# Patient Record
Sex: Female | Born: 1960 | Hispanic: No | Marital: Single | Smoking: Current every day smoker
Health system: Southern US, Community
[De-identification: ages and names within clinical notes are randomized; demographics above are authoritative.]

---

## 2006-03-17 ENCOUNTER — Ambulatory Visit: Payer: Self-pay | Admitting: Family Medicine

## 2010-08-03 ENCOUNTER — Emergency Department: Payer: Self-pay | Admitting: Emergency Medicine

## 2021-01-17 ENCOUNTER — Ambulatory Visit (HOSPITAL_COMMUNITY)
Admission: EM | Admit: 2021-01-17 | Discharge: 2021-01-17 | Disposition: A | Discharge status: Home / Self Care | Payer: Self-pay | Attending: Family Medicine | Admitting: Family Medicine

## 2021-01-17 ENCOUNTER — Ambulatory Visit (INDEPENDENT_AMBULATORY_CARE_PROVIDER_SITE_OTHER): Payer: Self-pay

## 2021-01-17 ENCOUNTER — Encounter (HOSPITAL_COMMUNITY): Payer: Self-pay

## 2021-01-17 ENCOUNTER — Other Ambulatory Visit: Payer: Self-pay

## 2021-01-17 DIAGNOSIS — W19XXXA Unspecified fall, initial encounter: Secondary | ICD-10-CM

## 2021-01-17 DIAGNOSIS — M25511 Pain in right shoulder: Secondary | ICD-10-CM

## 2021-01-17 DIAGNOSIS — S52501A Unspecified fracture of the lower end of right radius, initial encounter for closed fracture: Secondary | ICD-10-CM

## 2021-01-17 DIAGNOSIS — I10 Essential (primary) hypertension: Secondary | ICD-10-CM

## 2021-01-17 DIAGNOSIS — M25531 Pain in right wrist: Secondary | ICD-10-CM

## 2021-01-17 NOTE — ED Provider Notes (Signed)
[facility]   [id number] 01/17/21 Arrival Time: 0913  ASSESSMENT & PLAN:  1. Closed fracture of distal end of right radius, unspecified fracture morphology, initial encounter   2. Right wrist pain   3. Acute pain of right shoulder   4. Elevated blood pressure reading in office with diagnosis of hypertension    I have personally viewed the imaging studies ordered this visit. Distal radius fracture. Shoulder appears normal.    Discharge Instructions      Please rest, ice and elevate the affected extremity. If not allergic, you may take Motrin 600mg  every 8 hours or Tylenol 1000mg  every 6 hours or as needed for discomfort. Follow up with orthopaedic surgery within one week for further evaluation. Please call for any appointment. Do not remove your splint. Please return here if you are experiencing increased pain, tingling/numbness, swelling, redness, or fever.  Your blood pressure was noted to be elevated during your visit today. If you are currently taking medication for high blood pressure, please ensure you are taking this as directed. If you do not have a history of high blood pressure and your blood pressure remains persistently elevated, you may need to begin taking a medication at some point. You may return here within the next few days to recheck if unable to see your primary care provider or if you do not have a one.  BP (!) 189/103 (BP Location: Left Arm) Comment: Pt states she does not take BP medication  Pulse 94   Temp 98.2 F (36.8 C) (Oral)   Resp 19   SpO2 97%   BP Readings from Last 3 Encounters:  01/17/21 (!) 189/103      No symptoms of hypertensive urgency.  Orders Placed This Encounter  Procedures   DG Wrist Complete Right   DG Shoulder Right   Apply splint short arm   Apply Shoulder Immobilizer/Sling    Recommend:   Follow-up Information     Schedule an appointment as soon as possible for a visit  with , MD.   Specialty:  Orthopedic Surgery Contact information: [address] [place] [address] [place] [phone]                 Reviewed expectations re: course of current medical issues. Questions answered. Outlined signs and symptoms indicating need for more acute intervention. Patient verbalized understanding. After Visit Summary given.  SUBJECTIVE: History from: patient. Alyssa Jenkins is a 60 y.o. female who reports R wrist and shoulder pain s/p fall two d ago; most pain in wrist; some swelling. No extremity sensation changes or weakness. R-handed. No tx PTA. No head injury.  History reviewed. No pertinent surgical history.   Increased blood pressure noted today. Reports that she is not currently treated for HTN but has been told BP high regularly. She reports no chest pain on exertion, no dyspnea on exertion, no swelling of ankles, no orthostatic dizziness or lightheadedness, no orthopnea or paroxysmal nocturnal dyspnea, and no palpitations.   OBJECTIVE:  Vitals:   01/17/21 1035  BP: (!) 189/103  Pulse: 94  Resp: 19  Temp: 98.2 F (36.8 C)  TempSrc: Oral  SpO2: 97%    General appearance: alert; no distress HEENT: [place]; AT Neck: supple with FROM Resp: unlabored respirations Extremities: RUE: warm with well perfused appearance; fairly well localized moderate tenderness over right radius; without gross deformities; swelling: minimal; bruising: none; wirst ROM: limited by reported pain; no bony tenderness over shoulder; shoulder with FROM CV:  brisk extremity capillary refill of RUE; 2+ radial pulse of RUE. Skin: warm and dry; no visible rashes Neurologic: gait normal; normal sensation and strength of RUE Psychological: alert and cooperative; normal mood and affect  Imaging: DG Shoulder Right  Result Date: 01/17/2021 CLINICAL DATA:  Fall EXAM: RIGHT SHOULDER - 2+ VIEW COMPARISON:  None. FINDINGS: There is no evidence of acute fracture or dislocation on internal  rotation frontal and scapular Y-view. IMPRESSION: No evidence of acute fracture. Electronically Signed   By: Caprice Renshaw M.D.   On: 01/17/2021 11:25   DG Wrist Complete Right  Result Date: 01/17/2021 CLINICAL DATA:  Fall EXAM: RIGHT WRIST - COMPLETE 3+ VIEW COMPARISON:  None. FINDINGS: There is a nondisplaced distal radial metaphyseal fracture. Minimal loss of the volar tilt. Extensive adjacent soft tissue swelling. No visible intra-articular extension radiographically. Normal radiocarpal and intercarpal alignment. No evidence of concomitant ulnar fracture or additional fracture in the hand. Lunotriquetral coalition. IMPRESSION: Nondisplaced distal radial metaphyseal fracture. Electronically Signed   By: Caprice Renshaw M.D.   On: 01/17/2021 11:23      No Known Allergies  History reviewed. No pertinent past medical history. Social History   Socioeconomic History   Marital status: Single    Spouse name: Not on file   Number of children: Not on file   Years of education: Not on file   Highest education level: Not on file  Occupational History   Not on file  Tobacco Use   Smoking status: Every Day    Packs/day: 0.50    Types: Cigarettes   Smokeless tobacco: Never  Vaping Use   Vaping Use: Never used  Substance and Sexual Activity   Alcohol use: Yes   Drug use: Never   Sexual activity: Not on file  Other Topics Concern   Not on file  Social History Narrative   Not on file   Social Determinants of Health   Financial Resource Strain: Not on file  Food Insecurity: Not on file  Transportation Needs: Not on file  Physical Activity: Not on file  Stress: Not on file  Social Connections: Not on file   Family History  Problem Relation Age of Onset   Hypertension Mother    Seizures Father    History reviewed. No pertinent surgical history.     Mardella Layman, MD 01/17/21 1140

## 2021-01-17 NOTE — ED Notes (Signed)
Called in lobby, nos answered.

## 2021-01-17 NOTE — Discharge Instructions (Addendum)
Please rest, ice and elevate the affected extremity. If not allergic, you may take Motrin 600mg  every 8 hours or Tylenol 1000mg  every 6 hours or as needed for discomfort. Follow up with orthopaedic surgery within one week for further evaluation. Please call for any appointment. Do not remove your splint. Please return here if you are experiencing increased pain, tingling/numbness, swelling, redness, or fever.  Your blood pressure was noted to be elevated during your visit today. If you are currently taking medication for high blood pressure, please ensure you are taking this as directed. If you do not have a history of high blood pressure and your blood pressure remains persistently elevated, you may need to begin taking a medication at some point. You may return here within the next few days to recheck if unable to see your primary care provider or if you do not have a one.  BP (!) 189/103 (BP Location: Left Arm) Comment: Pt states she does not take BP medication  Pulse 94   Temp 98.2 F (36.8 C) (Oral)   Resp 19   SpO2 97%   BP Readings from Last 3 Encounters:  01/17/21 (!) 189/103

## 2021-01-17 NOTE — Progress Notes (Signed)
Orthopedic Tech Progress Note Patient Details:  Alyssa Jenkins 1961-03-09 [id number]  Ortho Devices Type of Ortho Device: Sugartong splint, Arm sling Ortho Device/Splint Location: RUE Ortho Device/Splint Interventions: Application, Ordered   Post Interventions Patient Tolerated: Well Instructions Provided: Care of device, Poper ambulation with device  Render Alyssa Jenkins 01/17/2021, 12:35 PM

## 2021-01-17 NOTE — ED Notes (Signed)
Ortho tech called 

## 2021-01-17 NOTE — ED Triage Notes (Signed)
Pt presents with right wrist injury. States she fell getting out of the tub on Tuesday night and c/o right shoulder pain.

## 2023-04-25 IMAGING — DX DG SHOULDER 2+V*R*
2 series · 2 of 2 positions shown · non-contrast
Comparison: None.

CLINICAL DATA: Fall

EXAM:
RIGHT SHOULDER - 2+ VIEW

[shoulder ap]
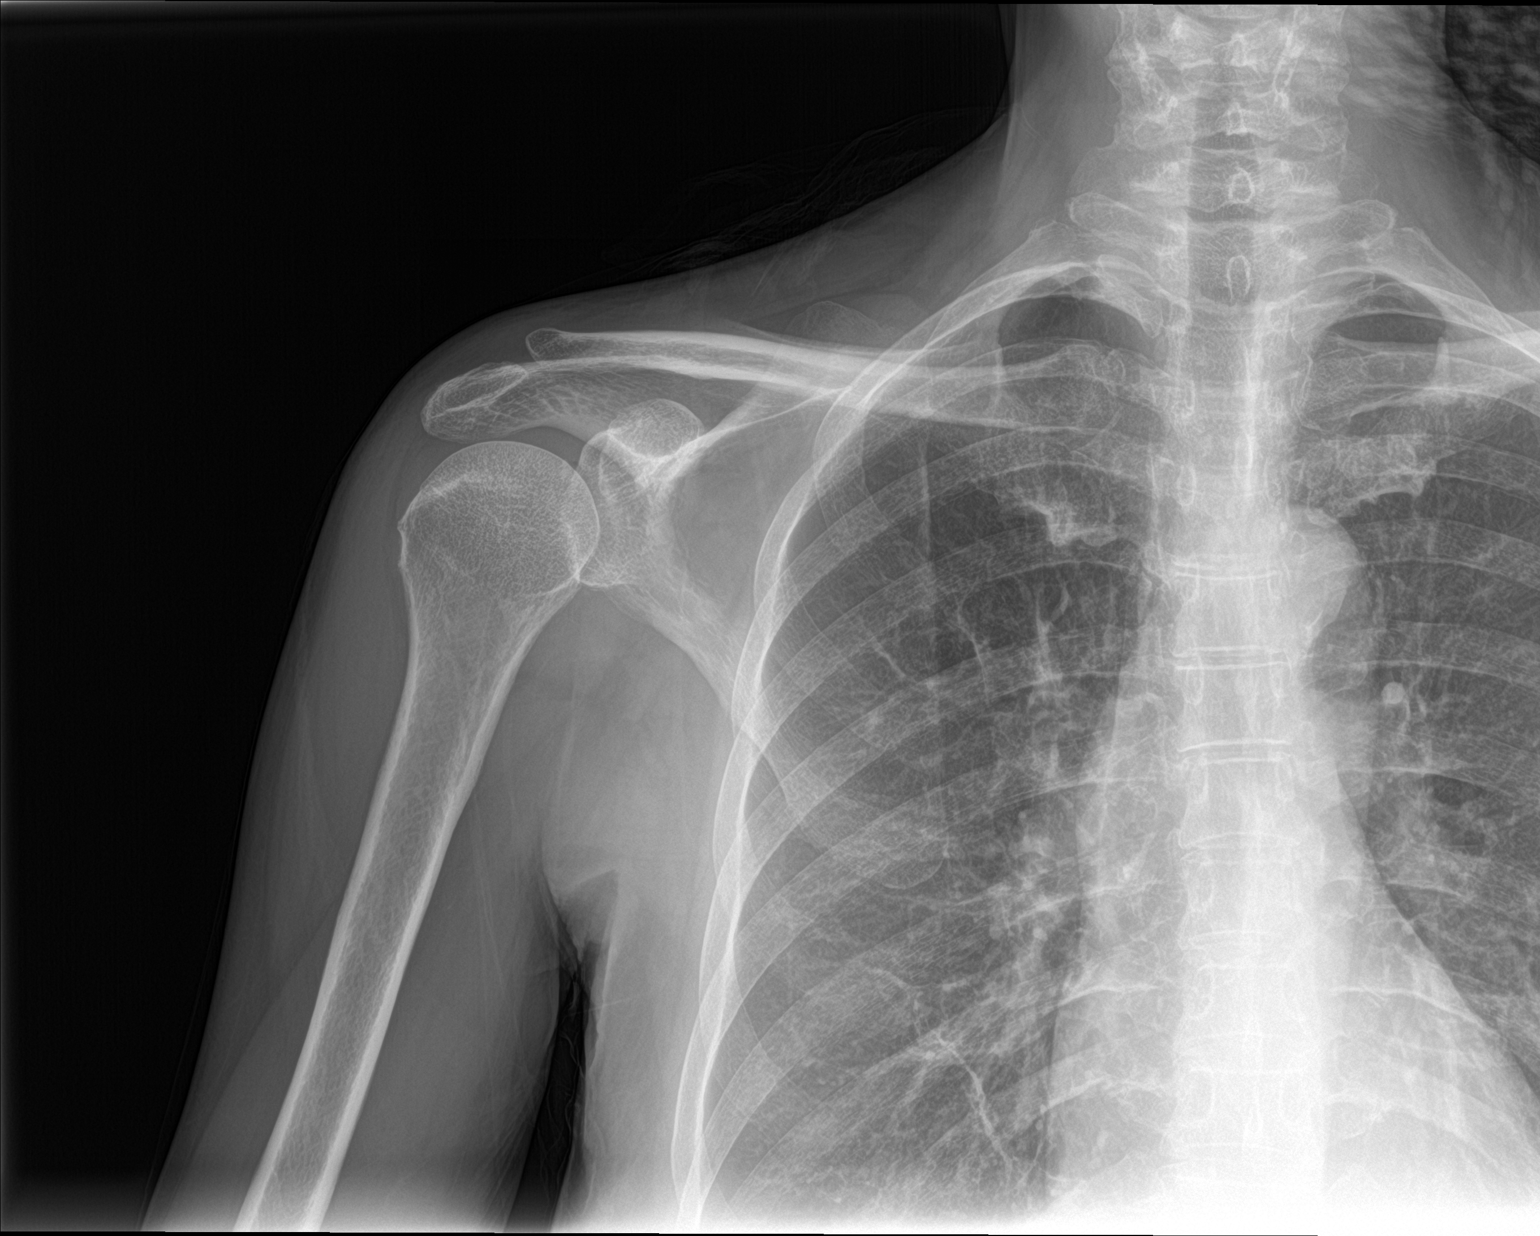

[shoulder y-view]
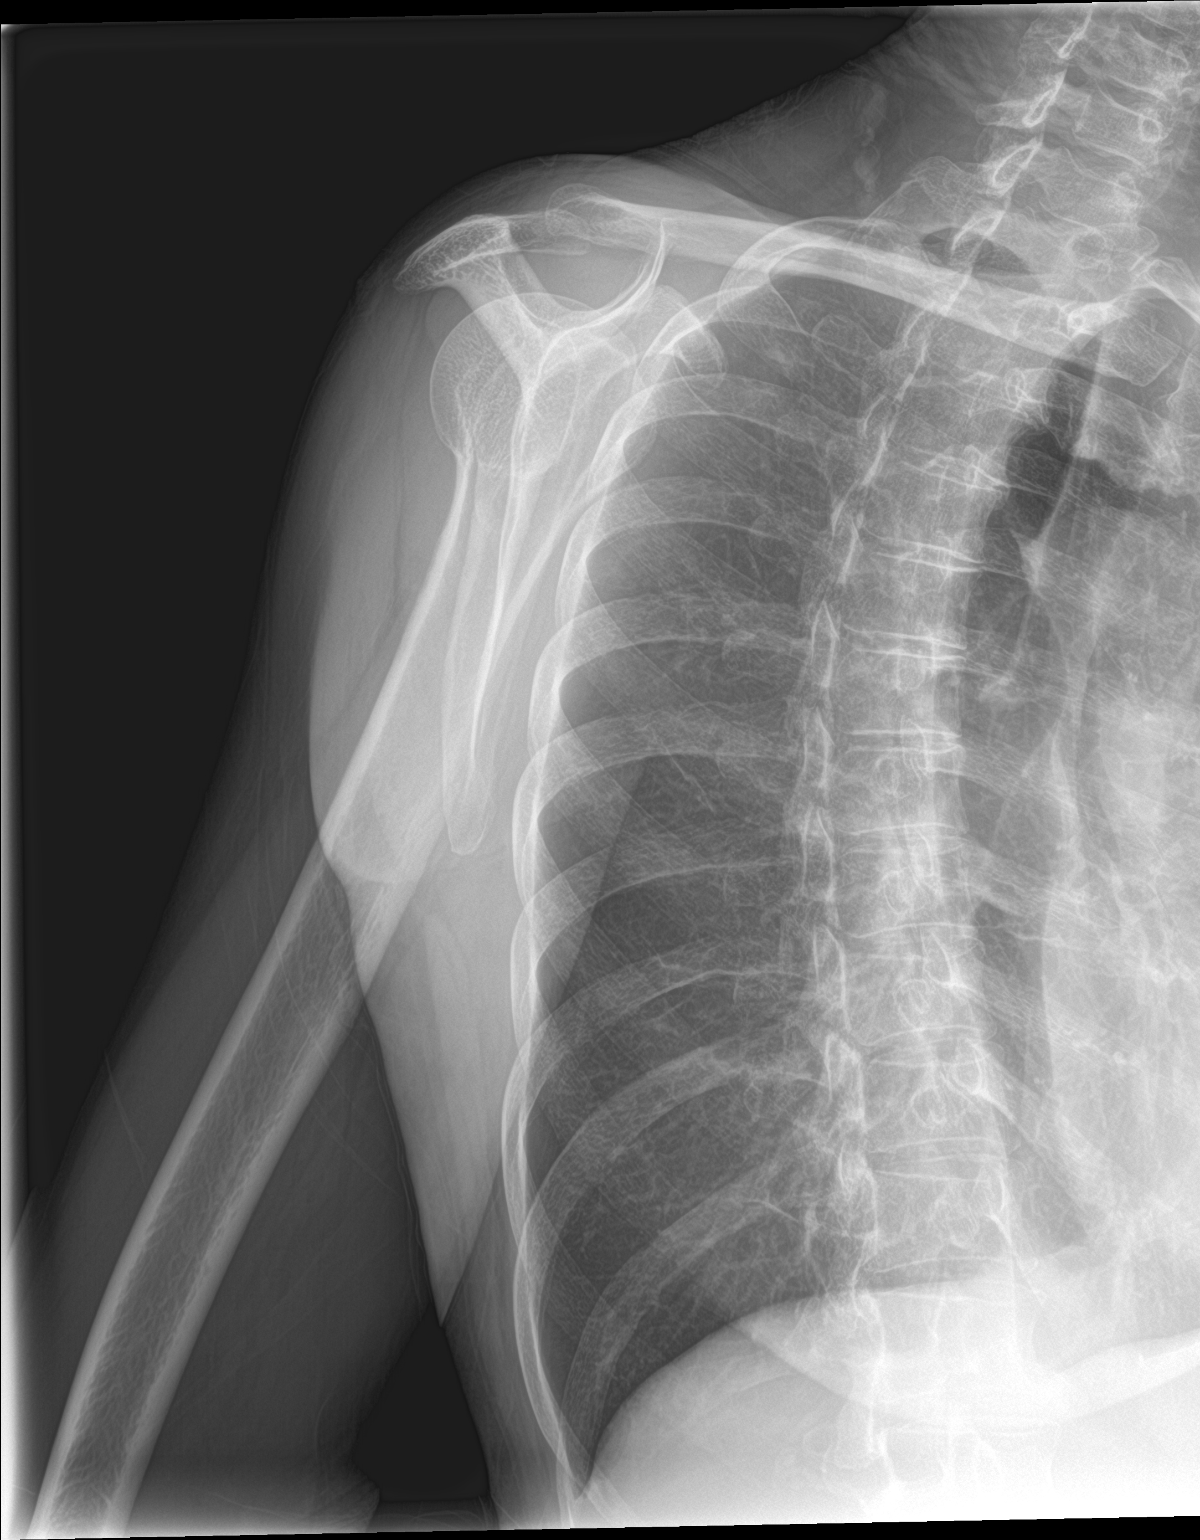

[2 of 2 positions shown; findings below may reference images not displayed]

FINDINGS: There is no evidence of acute fracture or dislocation on internal
rotation frontal and scapular Y-view.
IMPRESSION: No evidence of acute fracture.
